# Patient Record
Sex: Male | Born: 2000 | Race: White | Hispanic: No | Marital: Single | Smoking: Never smoker
Health system: Southern US, Community
[De-identification: ages and names within clinical notes are randomized; demographics above are authoritative.]

## PROBLEM LIST (undated history)

## (undated) DIAGNOSIS — F909 Attention-deficit hyperactivity disorder, unspecified type: Secondary | ICD-10-CM

## (undated) HISTORY — DX: Attention-deficit hyperactivity disorder, unspecified type: F90.9

---
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
## 2021-11-11 DIAGNOSIS — M7701 Medial epicondylitis, right elbow: Secondary | ICD-10-CM | POA: Diagnosis not present

## 2021-11-11 DIAGNOSIS — M25521 Pain in right elbow: Secondary | ICD-10-CM | POA: Diagnosis not present

## 2022-05-25 ENCOUNTER — Ambulatory Visit: Payer: Federal, State, Local not specified - PPO | Admitting: Family Medicine

## 2022-05-25 DIAGNOSIS — F988 Other specified behavioral and emotional disorders with onset usually occurring in childhood and adolescence: Secondary | ICD-10-CM | POA: Diagnosis not present

## 2022-05-25 MED ORDER — AMPHETAMINE-DEXTROAMPHET ER 20 MG PO CP24: 20.0000 mg | ORAL_CAPSULE | ORAL | 0 refills | Status: DC

## 2022-05-25 NOTE — Patient Instructions (Addendum)
Medication as prescribed.  If gets approved and works well, I will fill for the next 2 months.  Take care  Dr. Adriana Simas

## 2022-05-25 NOTE — Assessment & Plan Note (Signed)
Trial of Adderall

## 2022-05-25 NOTE — Progress Notes (Signed)
Subjective:  Patient ID: Omar Ramos, male    DOB: Sep 07, 2001  Age: 21 y.o. MRN: [id number]  CC: Chief Complaint  Patient presents with   Establish Care    Discuss ADHD    HPI:  21 year old male with reported diagnosis of ADHD as a child which required treatment presents to establish care.  He is essentially here to discuss ADHD.  Patient states that as a child he was placed on stimulant medication for ADHD.  He is currently in college at [employer].  He has been doing fairly well.  GPA 3.4.  States that he often has trouble focusing and paying attention.  His symptoms seem to be worse over the past few weeks.  Recently broke up with his girlfriend.  This is likely a contributing factor to his inattention and lack of focus.  He is anxious about returning to school especially since he has classes with his former girlfriend.  He would like to discuss medication options regarding ADHD/inattention.  Social Hx   Social History   Socioeconomic History   Marital status: Single    Spouse name: Not on file   Number of children: Not on file   Years of education: Not on file   Highest education level: Not on file  Occupational History   Not on file  Tobacco Use   Smoking status: Never   Smokeless tobacco: Not on file  Substance and Sexual Activity   Alcohol use: Not on file   Drug use: Not on file   Sexual activity: Not on file  Other Topics Concern   Not on file  Social History Narrative   Not on file   Social Determinants of Health   Financial Resource Strain: Not on file  Food Insecurity: Not on file  Transportation Needs: Not on file  Physical Activity: Not on file  Stress: Not on file  Social Connections: Not on file    Review of Systems Per HPI  Objective:  BP 124/60   Pulse 67   Temp 98.4 F (36.9 C)   Ht 5\' 7"  (1.702 m)   Wt 183 lb (83 kg)   SpO2 99%   BMI 28.66 kg/m      05/25/2022   10:00 AM 12/03/2013    5:50 PM 07/17/2013    2:27 PM   BP/Weight  Systolic BP 124 122 90  Diastolic BP 60 84 58  Wt. (Lbs) 183 146.4 143.5  BMI 28.66 kg/m2 22.93 kg/m2     Physical Exam Constitutional:      General: He is not in acute distress.    Appearance: Normal appearance. He is not ill-appearing.  HENT:     Head: Normocephalic and atraumatic.  Cardiovascular:     Rate and Rhythm: Normal rate and regular rhythm.  Pulmonary:     Effort: Pulmonary effort is normal.     Breath sounds: Normal breath sounds. No wheezing, rhonchi or rales.  Neurological:     Mental Status: He is alert.  Psychiatric:        Mood and Affect: Mood normal.        Behavior: Behavior normal.    Assessment & Plan:   Problem List Items Addressed This Visit       Other   ADD (attention deficit disorder)    Trial of Adderall.       Meds ordered this encounter  Medications   amphetamine-dextroamphetamine (ADDERALL XR) 20 MG 24 hr capsule    Sig: Take 1  capsule (20 mg total) by mouth every morning.    Dispense:  30 capsule    Refill:  0    Follow-up:  Return in about 3 months (around 08/25/2022).  Everlene Other DO [facility]

## 2022-05-28 ENCOUNTER — Telehealth: Payer: Self-pay | Admitting: Family Medicine

## 2022-05-28 NOTE — Telephone Encounter (Signed)
PA completed for Adderall XR. Approved form 04/27/22-05/27/23

## 2022-06-03 ENCOUNTER — Telehealth: Payer: Self-pay | Admitting: Family Medicine

## 2022-06-03 DIAGNOSIS — T50905A Adverse effect of unspecified drugs, medicaments and biological substances, initial encounter: Secondary | ICD-10-CM | POA: Diagnosis not present

## 2022-06-03 DIAGNOSIS — R079 Chest pain, unspecified: Secondary | ICD-10-CM | POA: Diagnosis not present

## 2022-06-03 DIAGNOSIS — R002 Palpitations: Secondary | ICD-10-CM | POA: Diagnosis not present

## 2022-06-03 DIAGNOSIS — R0600 Dyspnea, unspecified: Secondary | ICD-10-CM | POA: Diagnosis not present

## 2022-06-03 NOTE — Telephone Encounter (Signed)
Patient advised per Dr Lorin Picket: ADD medicine can cause side effects-the amount of different side effects he is having including the coolness to the touch irregular heartbeat shortness of breath requires that he go to be seen at a ER or urgent care for further evaluation today.  Also I believe it is reasonable to hold off on taking ADD medicine and if he decides he wants to restart medicine to restart it at a potentially lower dose.  First thing first is to stop ADD medicine and to be seen today either in the ER or urgent care.  Then he can do follow-up visit with Dr. Adriana Simas regarding if he wants to restart the medicine at a lower dose etc.  Patient verbalized understanding

## 2022-06-03 NOTE — Telephone Encounter (Signed)
Nurses-although ADD medicine can cause side effects-the amount of different side effects he is having including the coolness to the touch irregular heartbeat shortness of breath requires that he go to be seen at a ER or urgent care for further evaluation today.  Also I believe it is reasonable to hold off on taking ADD medicine and if he decides he wants to restart medicine to restart it at a potentially lower dose.  First thing first is to stop ADD medicine and to be seen today either in the ER or urgent care.  Then he can do follow-up visit with Dr. Adriana Simas regarding if he wants to restart the medicine at a lower dose etc.

## 2022-06-03 NOTE — Telephone Encounter (Signed)
Pt contacted. Pt states he began noticing Coldness in leg ,Tingling in left arm at times,Chest tightness, Sweating, irregular heartbeat and Shortness of breath. Symptoms on and off; noticed them on Tuesday and they have worsening. Pt began Adderall Monday. Please advise. Thank you

## 2022-06-03 NOTE — Telephone Encounter (Signed)
Please call him at [phone]

## 2022-06-03 NOTE — Telephone Encounter (Signed)
Patient is having side effects from adderall he stated . He is in college in [place].

## 2022-08-25 ENCOUNTER — Ambulatory Visit: Payer: Federal, State, Local not specified - PPO | Admitting: Family Medicine

## 2022-09-29 ENCOUNTER — Ambulatory Visit
Admission: RE | Admit: 2022-09-29 | Discharge: 2022-09-29 | Disposition: A | Discharge status: Home / Self Care | Payer: Federal, State, Local not specified - PPO | Source: Ambulatory Visit | Attending: Emergency Medicine | Admitting: Emergency Medicine

## 2022-09-29 VITALS — BP 128/80 | HR 71 | Temp 98.8°F | Resp 17

## 2022-09-29 DIAGNOSIS — J020 Streptococcal pharyngitis: Secondary | ICD-10-CM | POA: Diagnosis not present

## 2022-09-29 LAB — POCT RAPID STREP A (OFFICE): Rapid Strep A Screen: POSITIVE — AB

## 2022-09-29 MED ORDER — AMOXICILLIN 500 MG PO CAPS: 500.0000 mg | ORAL_CAPSULE | Freq: Two times a day (BID) | ORAL | 0 refills | Status: AC

## 2022-09-29 NOTE — Discharge Instructions (Addendum)
Your strep test today is positive.  I recommend that you begin antibiotics now for treatment.  I have sent a prescription to your pharmacy.  Please take them as prescribed.  You will begin to feel better in about 24 hours.  Please be sure that you you finish the entire 10-day course of treatment to avoid worsening infection that may require longer treatment with stronger antibiotics.   After 24 hours of taking antibiotics, you are no longer contagious.  Please discard your toothbrush as well as any other oral devices that you are currently using and replace them with new ones to avoid reinfection.   Please read below to learn more about the medications, dosages and frequencies that I recommend to help alleviate your symptoms and to get you feeling better soon:   Amoxicillin:  take 1 tablet tablet twice daily for 10 days, you can take it with or without food.  This antibiotic can cause upset stomach, this will resolve once antibiotics are complete.  You are welcome to use a probiotic, eat yogurt, take Imodium while taking this medication.  Please avoid other systemic medications such as Maalox, Pepto-Bismol or milk of magnesia as they can interfere with your body's ability to absorb the antibiotics. Advil, Motrin (ibuprofen): This is a good anti-inflammatory medication which addresses aches, pains and inflammation of the upper airways that causes sinus and nasal congestion as well as in the lower airways which makes your cough feel tight and sometimes burn.  I recommend that you take between 400 to 600 mg every 6-8 hours as needed.      Chloraseptic Throat Spray: Spray 5 sprays into affected area every 2 hours, hold for 15 seconds and either swallow or spit it out.  This is a excellent numbing medication because it is a spray, you can put it right where you needed and so sucking on a lozenge and numbing your entire mouth.      Please follow-up within the next 7-10 days either with your primary care  provider or urgent care if your symptoms do not resolve.  If you do not have a primary care provider, we will assist you in finding one.        Thank you for visiting urgent care today.  We appreciate the opportunity to participate in your care.  

## 2022-09-29 NOTE — ED Triage Notes (Signed)
Pt c/o sinus pressure, fatigue, sore throat and dizziness.  Started: Tuesday   Home interventions: nyquil, vitamins

## 2022-09-29 NOTE — ED Provider Notes (Signed)
[facility]    CSN: [id number] Arrival date & time: 09/29/22  [id number]    HISTORY   Chief Complaint  Patient presents with   Sore Throat   Fatigue   Dizziness   HPI [place] Omar Ramos is a pleasant, 21 y.o. male who presents to urgent care today. Patient reports a 1 day history of sore throat, fatigue, sinus pressure and dizziness.  Patient states has been taking NyQuil and vitamin C.  Patient reports being exposed to strep 8 days ago.  Patient states prior to yesterday he felt fine.  Patient denies nausea, vomiting, diarrhea.  Patient reports a nonproductive cough which is worse in the morning.  Patient states when he had COVID-19 1 year ago, his first symptom was dizziness and would like to be tested today.    Past Medical History:  Diagnosis Date   ADHD (attention deficit hyperactivity disorder)    Patient Active Problem List   Diagnosis Date Noted   ADD (attention deficit disorder) 05/25/2022   History reviewed. No pertinent surgical history.  Home Medications    Prior to Admission medications   Medication Sig Start Date End Date Taking? Authorizing Provider    Family History Family History  Problem Relation Age of Onset   Hypertension Father    Social History Social History   Tobacco Use   Smoking status: Never   Allergies   Patient has no known allergies.  Review of Systems Review of Systems Pertinent findings revealed after performing a 14 point review of systems has been noted in the history of present illness.  Physical Exam Triage Vital Signs ED Triage Vitals  Enc Vitals Group     BP 08/07/21 0827 (!) 147/82     Pulse Rate 08/07/21 0827 72     Resp 08/07/21 0827 18     Temp 08/07/21 0827 98.3 F (36.8 C)     Temp Source 08/07/21 0827 Oral     SpO2 08/07/21 0827 98 %     Weight --      Height --      Head Circumference --      Peak Flow --      Pain Score 08/07/21 0826 5     Pain Loc --      Pain Edu? --      Excl. in [place]? --    No data found.  Updated Vital Signs BP 128/80 (BP Location: Left Arm)   Pulse 71   Temp 98.8 F (37.1 C) (Oral)   Resp 17   SpO2 98%   Physical Exam Vitals and nursing note reviewed.  Constitutional:      General: He is awake. He is not in acute distress.    Appearance: Normal appearance. He is well-developed and well-groomed. He is ill-appearing. He is not toxic-appearing or diaphoretic.  HENT:     Head: Normocephalic and atraumatic.     Salivary Glands: Right salivary gland is diffusely enlarged and tender. Left salivary gland is diffusely enlarged and tender.     Right Ear: Hearing and external ear normal.     Left Ear: Hearing and external ear normal.     Ears:     Comments: Bilateral EACs with mild erythema, bilateral TMs are normal    Nose: Congestion and rhinorrhea present. No mucosal edema. Rhinorrhea is clear.     Right Turbinates: Not enlarged, swollen or pale.     Left Turbinates: Not enlarged or swollen.     Right Sinus: No  maxillary sinus tenderness or frontal sinus tenderness.     Left Sinus: No maxillary sinus tenderness or frontal sinus tenderness.     Mouth/Throat:     Lips: Pink. No lesions.     Mouth: Mucous membranes are moist. No oral lesions or angioedema.     Dentition: No gingival swelling.     Tongue: No lesions.     Palate: No mass.     Pharynx: Uvula midline. Pharyngeal swelling, oropharyngeal exudate and posterior oropharyngeal erythema present. No uvula swelling.     Tonsils: Tonsillar exudate present. No tonsillar abscesses. 1+ on the right. 1+ on the left.  Eyes:     Extraocular Movements: Extraocular movements intact.     Conjunctiva/sclera: Conjunctivae normal.     Pupils: Pupils are equal, round, and reactive to light.  Neck:     Thyroid: No thyroid mass, thyromegaly or thyroid tenderness.     Trachea: Tracheal tenderness present. No abnormal tracheal secretions or tracheal deviation.     Comments: Voice is muffled Cardiovascular:      Rate and Rhythm: Normal rate and regular rhythm.     Pulses: Normal pulses.     Heart sounds: Normal heart sounds, S1 normal and S2 normal. No murmur heard.    No friction rub. No gallop.  Pulmonary:     Effort: Pulmonary effort is normal. No accessory muscle usage, prolonged expiration, respiratory distress or retractions.     Breath sounds: No stridor, decreased air movement or transmitted upper airway sounds. No decreased breath sounds, wheezing, rhonchi or rales.  Abdominal:     General: Bowel sounds are normal.     Palpations: Abdomen is soft.     Tenderness: There is generalized abdominal tenderness. There is no right CVA tenderness, left CVA tenderness or rebound. Negative signs include Murphy's sign.     Hernia: No hernia is present.  Musculoskeletal:        General: No tenderness. Normal range of motion.     Cervical back: Full passive range of motion without pain, normal range of motion and neck supple.     Right lower leg: No edema.     Left lower leg: No edema.  Lymphadenopathy:     Cervical: Cervical adenopathy present.     Right cervical: Superficial cervical adenopathy and posterior cervical adenopathy present.     Left cervical: Superficial cervical adenopathy and posterior cervical adenopathy present.  Skin:    General: Skin is warm and dry.     Findings: No erythema, lesion or rash.  Neurological:     General: No focal deficit present.     Mental Status: He is alert and oriented to person, place, and time. Mental status is at baseline.  Psychiatric:        Mood and Affect: Mood normal.        Behavior: Behavior normal. Behavior is cooperative.        Thought Content: Thought content normal.        Judgment: Judgment normal.     Visual Acuity Right Eye Distance:   Left Eye Distance:   Bilateral Distance:    Right Eye Near:   Left Eye Near:    Bilateral Near:     UC Couse / Diagnostics / Procedures:     Radiology No results  found.  Procedures Procedures (including critical care time) EKG  Pending results:  Labs Reviewed  POCT RAPID STREP A (OFFICE) - Abnormal; Notable for the following components:  Result Value   Rapid Strep A Screen Positive (*)    All other components within normal limits    Medications Ordered in UC: Medications - No data to display  UC Diagnoses / Final Clinical Impressions(s)   I have reviewed the triage vital signs and the nursing notes.  Pertinent labs & imaging results that were available during my care of the patient were reviewed by me and considered in my medical decision making (see chart for details).    Final diagnoses:  Acute streptococcal pharyngitis   Rapid strep test is positive, patient provided with a 10-day course of amoxicillin.  Return precautions advised. Please see discharge instructions below for further details of plan of care as provided to patient. ED Prescriptions     Medication Sig Dispense Auth. Provider   amoxicillin (AMOXIL) 500 MG capsule Take 1 capsule (500 mg total) by mouth 2 (two) times daily for 10 days. 20 capsule Lynden Oxford Scales, PA-C      PDMP not reviewed this encounter.  Disposition Upon Discharge:  Condition: stable for discharge home Home: take medications as prescribed; routine discharge instructions as discussed; follow up as advised.  Patient presented with an acute illness with associated systemic symptoms and significant discomfort requiring urgent management. In my opinion, this is a condition that a prudent lay person (someone who possesses an average knowledge of health and medicine) may potentially expect to result in complications if not addressed urgently such as respiratory distress, impairment of bodily function or dysfunction of bodily organs.   Routine symptom specific, illness specific and/or disease specific instructions were discussed with the patient and/or caregiver at length.   As such, the patient  has been evaluated and assessed, work-up was performed and treatment was provided in alignment with urgent care protocols and evidence based medicine.  Patient/parent/caregiver has been advised that the patient may require follow up for further testing and treatment if the symptoms continue in spite of treatment, as clinically indicated and appropriate.  If the patient was tested for COVID-19, Influenza and/or RSV, then the patient/parent/guardian was advised to isolate at home pending the results of his/her diagnostic coronavirus test and potentially longer if they're positive. I have also advised pt that if his/her COVID-19 test returns positive, it's recommended to self-isolate for at least 10 days after symptoms first appeared AND until fever-free for 24 hours without fever reducer AND other symptoms have improved or resolved. Discussed self-isolation recommendations as well as instructions for household member/close contacts as per the [facility] and [place] DHHS, and also gave patient the [place] packet with this information.  Patient/parent/caregiver has been advised to return to the [facility] or PCP in 3-5 days if no better; to PCP or the Emergency Department if new signs and symptoms develop, or if the current signs or symptoms continue to change or worsen for further workup, evaluation and treatment as clinically indicated and appropriate  The patient will follow up with their current PCP if and as advised. If the patient does not currently have a PCP we will assist them in obtaining one.   The patient may need specialty follow up if the symptoms continue, in spite of conservative treatment and management, for further workup, evaluation, consultation and treatment as clinically indicated and appropriate.  Patient/parent/caregiver verbalized understanding and agreement of plan as discussed.  All questions were addressed during visit.  Please see discharge instructions below for further details of plan.  Discharge  Instructions:   Discharge Instructions  Your strep test today is positive.  I recommend that you begin antibiotics now for treatment.  I have sent a prescription to your pharmacy.  Please take them as prescribed.  You will begin to feel better in about 24 hours.  Please be sure that you you finish the entire 10-day course of treatment to avoid worsening infection that may require longer treatment with stronger antibiotics.   After 24 hours of taking antibiotics, you are no longer contagious.  Please discard your toothbrush as well as any other oral devices that you are currently using and replace them with new ones to avoid reinfection.   Please read below to learn more about the medications, dosages and frequencies that I recommend to help alleviate your symptoms and to get you feeling better soon:   Amoxicillin:  take 1 tablet tablet twice daily for 10 days, you can take it with or without food.  This antibiotic can cause upset stomach, this will resolve once antibiotics are complete.  You are welcome to use a probiotic, eat yogurt, take Imodium while taking this medication.  Please avoid other systemic medications such as Maalox, Pepto-Bismol or milk of magnesia as they can interfere with your body's ability to absorb the antibiotics.  Advil, Motrin (ibuprofen): This is a good anti-inflammatory medication which addresses aches, pains and inflammation of the upper airways that causes sinus and nasal congestion as well as in the lower airways which makes your cough feel tight and sometimes burn.  I recommend that you take between 400 to 600 mg every 6-8 hours as needed.      Chloraseptic Throat Spray: Spray 5 sprays into affected area every 2 hours, hold for 15 seconds and either swallow or spit it out.  This is a excellent numbing medication because it is a spray, you can put it right where you needed and so sucking on a lozenge and numbing your entire mouth.       Please follow-up within  the next 7-10 days either with your primary care provider or urgent care if your symptoms do not resolve.  If you do not have a primary care provider, we will assist you in finding one.        Thank you for visiting urgent care today.  We appreciate the opportunity to participate in your care.       This office note has been dictated using Teaching laboratory technician.  Unfortunately, this method of dictation can sometimes lead to typographical or grammatical errors.  I apologize for your inconvenience in advance if this occurs.  Please do not hesitate to reach out to me if clarification is needed.      Theadora Rama Scales, PA-C 09/29/22 1038

## 2024-06-12 ENCOUNTER — Ambulatory Visit
Admission: RE | Admit: 2024-06-12 | Discharge: 2024-06-12 | Disposition: A | Discharge status: Home / Self Care | Payer: Self-pay | Source: Ambulatory Visit | Attending: Family Medicine | Admitting: Family Medicine

## 2024-06-12 ENCOUNTER — Ambulatory Visit (INDEPENDENT_AMBULATORY_CARE_PROVIDER_SITE_OTHER)

## 2024-06-12 ENCOUNTER — Telehealth: Payer: Self-pay | Admitting: Emergency Medicine

## 2024-06-12 ENCOUNTER — Ambulatory Visit: Payer: Self-pay | Admitting: Family Medicine

## 2024-06-12 VITALS — BP 121/69 | HR 50 | Temp 98.1°F | Resp 16

## 2024-06-12 DIAGNOSIS — M25571 Pain in right ankle and joints of right foot: Secondary | ICD-10-CM

## 2024-06-12 NOTE — Discharge Instructions (Signed)
 We will let you know once your radiology report comes back and provide any updated recommendations.  For now, we have wrapped her ankle and you may use the rest, ice, compression, elevation protocol as well as over-the-counter pain relievers.

## 2024-06-12 NOTE — ED Triage Notes (Signed)
 Pt reports he twisted his right ankle x 3 weeks ago and after applying ice during that time is hasn't gotten any better. Hurts to go down steps and turn it from side to side.  Playing baseball and rolled R ankle

## 2024-06-12 NOTE — ED Provider Notes (Signed)
 [facility]    CSN: [id number] Arrival date & time: 06/12/24  0901      History   Chief Complaint Chief Complaint  Patient presents with   Ankle Pain    Twisted right ankle 3 weeks ago playing baseball, still having some discomfort when twisting and turning the ankle. - Entered by patient    HPI Omar Ramos is a 23 y.o. male.   Patient presenting today with 3-week history of right lateral ankle pain and swelling after rolling the ankle inward playing baseball.  He states he rested for about a week but then went back to playing through the pain.  Pain still has not gotten much better and still having swelling and difficulty turning the foot inward.  Denies numbness, tingling, weakness, loss of range of motion.  Has been icing and taking over-the-counter pain relievers with mild temporary benefit.    Past Medical History:  Diagnosis Date   ADHD (attention deficit hyperactivity disorder)     Patient Active Problem List   Diagnosis Date Noted   ADD (attention deficit disorder) 05/25/2022    History reviewed. No pertinent surgical history.     Home Medications    Prior to Admission medications   Not on File    Family History Family History  Problem Relation Age of Onset   Hypertension Father     Social History Social History   Tobacco Use   Smoking status: Never     Allergies   Patient has no known allergies.   Review of Systems Review of Systems PER HPI  Physical Exam Triage Vital Signs ED Triage Vitals [06/12/24 0921]  Encounter Vitals Group     BP 121/69     Girls Systolic BP Percentile      Girls Diastolic BP Percentile      Boys Systolic BP Percentile      Boys Diastolic BP Percentile      Pulse Rate (!) 50     Resp 16     Temp 98.1 F (36.7 C)     Temp Source Oral     SpO2 98 %     Weight      Height      Head Circumference      Peak Flow      Pain Score 5     Pain Loc      Pain Education      Exclude from Growth  Chart    No data found.  Updated Vital Signs BP 121/69 (BP Location: Right Arm)   Pulse (!) 50   Temp 98.1 F (36.7 C) (Oral)   Resp 16   SpO2 98%   Visual Acuity Right Eye Distance:   Left Eye Distance:   Bilateral Distance:    Right Eye Near:   Left Eye Near:    Bilateral Near:     Physical Exam Vitals and nursing note reviewed.  Constitutional:      Appearance: Normal appearance.  HENT:     Head: Atraumatic.  Eyes:     Extraocular Movements: Extraocular movements intact.     Conjunctiva/sclera: Conjunctivae normal.  Cardiovascular:     Rate and Rhythm: Normal rate.  Pulmonary:     Effort: Pulmonary effort is normal.  Musculoskeletal:        General: Swelling, tenderness and signs of injury present. No deformity. Normal range of motion.     Cervical back: Normal range of motion and neck supple.  Comments: Right lateral ankle mildly edematous, tender to palpation.  Range of motion intact but painful with inversion of the right foot  Skin:    General: Skin is warm and dry.     Findings: No bruising or erythema.  Neurological:     General: No focal deficit present.     Mental Status: He is oriented to person, place, and time.     Motor: No weakness.     Gait: Gait normal.     Comments: Right lower extremity neurovascularly intact  Psychiatric:        Mood and Affect: Mood normal.        Thought Content: Thought content normal.        Judgment: Judgment normal.      UC Treatments / Results  Labs (all labs ordered are listed, but only abnormal results are displayed) Labs Reviewed - No data to display  EKG   Radiology DG Ankle Complete Right Result Date: 06/12/2024 CLINICAL DATA:  Pain, injury EXAM: RIGHT ANKLE - COMPLETE 3+ VIEW COMPARISON:  None Available. FINDINGS: There is no evidence of fracture, dislocation, or joint effusion. There is no evidence of arthropathy or other focal bone abnormality. Soft tissues are unremarkable. IMPRESSION: Negative.  Electronically Signed   By: Michaeline Blanch M.D.   On: 06/12/2024 10:13    Procedures Procedures (including critical care time)  Medications Ordered in UC Medications - No data to display  Initial Impression / Assessment and Plan / UC Course  I have reviewed the triage vital signs and the nursing notes.  Pertinent labs & imaging results that were available during my care of the patient were reviewed by me and considered in my medical decision making (see chart for details).     X-ray of the right ankle negative for acute bony abnormality.  Ace wrap applied, discussed RICE protocol, over-the-counter pain relievers.  Return for worsening symptoms.  Final Clinical Impressions(s) / UC Diagnoses   Final diagnoses:  Acute right ankle pain     Discharge Instructions      We will let you know once your radiology report comes back and provide any updated recommendations.  For now, we have wrapped her ankle and you may use the rest, ice, compression, elevation protocol as well as over-the-counter pain relievers.    ED Prescriptions   None    PDMP not reviewed this encounter.   Stuart Vernell Norris, [place] 06/12/24 1121

## 2024-06-12 NOTE — Telephone Encounter (Signed)
 Per provider, ankle x-ray was negative and to continue as planned with Ace wrap, RICE protocol, over-the-counter pain relievers.Attempted pt contact numbers provided in chart x2. Went to [employer]. Voicemail with UC contact information provided. UC Provider aware.
